# Patient Record
Sex: Female | Born: 1985 | Race: White | Hispanic: Yes | Marital: Married | Smoking: Never smoker
Health system: Southern US, Community
[De-identification: ages and names within clinical notes are randomized; demographics above are authoritative.]

---

## 2009-03-05 ENCOUNTER — Encounter: Payer: Self-pay | Admitting: Family Medicine

## 2009-03-05 ENCOUNTER — Ambulatory Visit (HOSPITAL_COMMUNITY): Admission: RE | Admit: 2009-03-05 | Discharge: 2009-03-05 | Payer: Self-pay | Admitting: Family Medicine

## 2009-04-27 ENCOUNTER — Ambulatory Visit: Payer: Self-pay | Admitting: Obstetrics and Gynecology

## 2009-04-27 ENCOUNTER — Observation Stay (HOSPITAL_COMMUNITY): Admission: AD | Admit: 2009-04-27 | Discharge: 2009-04-28 | Payer: Self-pay | Admitting: Obstetrics & Gynecology

## 2009-05-30 ENCOUNTER — Inpatient Hospital Stay (HOSPITAL_COMMUNITY): Admission: AD | Admit: 2009-05-30 | Discharge: 2009-06-01 | Payer: Self-pay | Admitting: Obstetrics & Gynecology

## 2009-05-30 ENCOUNTER — Ambulatory Visit: Payer: Self-pay | Admitting: Obstetrics and Gynecology

## 2010-12-16 LAB — CBC
HCT: 40.1 % (ref 36.0–46.0)
Platelets: 192 10*3/uL (ref 150–400)
RDW: 13.7 % (ref 11.5–15.5)

## 2010-12-16 LAB — RPR: RPR Ser Ql: NONREACTIVE

## 2010-12-17 LAB — URINALYSIS, ROUTINE W REFLEX MICROSCOPIC
Bilirubin Urine: NEGATIVE
Leukocytes, UA: NEGATIVE
Nitrite: NEGATIVE
Specific Gravity, Urine: <1.005 — ABNORMAL LOW (ref 1.005–1.030)
pH: 6 (ref 5.0–8.0)

## 2010-12-17 LAB — BASIC METABOLIC PANEL
Calcium: 8.5 mg/dL (ref 8.4–10.5)
GFR calc Af Amer: >60 mL/min (ref >60)
GFR calc non Af Amer: >60 mL/min (ref >60)
Glucose, Bld: 92 mg/dL (ref 70–99)
Sodium: 133 mEq/L — ABNORMAL LOW (ref 135–145)

## 2010-12-17 LAB — URINE MICROSCOPIC-ADD ON

## 2010-12-17 LAB — CBC
Hemoglobin: 13.9 g/dL (ref 12.0–15.0)
RDW: 13.1 % (ref 11.5–15.5)

## 2010-12-17 LAB — CULTURE, BLOOD (ROUTINE X 2): Culture: NO GROWTH

## 2010-12-17 LAB — DIFFERENTIAL
Basophils Absolute: 0 10*3/uL (ref 0.0–0.1)
Lymphocytes Relative: 4 % — ABNORMAL LOW (ref 12–46)
Monocytes Absolute: 0.8 10*3/uL (ref 0.1–1.0)
Neutro Abs: 12.7 10*3/uL — ABNORMAL HIGH (ref 1.7–7.7)

## 2010-12-17 LAB — STOOL CULTURE

## 2010-12-17 LAB — CLOSTRIDIUM DIFFICILE EIA

## 2010-12-17 LAB — HEMOCCULT GUIAC POC 1CARD (OFFICE): Fecal Occult Bld: POSITIVE

## 2011-01-24 NOTE — Discharge Summary (Signed)
[name], [name]       ACCOUNT NO.:  [id number]   MEDICAL RECORD NO.:  [id number]          PATIENT TYPE:  OBV   LOCATION:  9158                          FACILITY:  [facility]   PHYSICIAN:  Catalina Antigua, MD     DATE OF BIRTH:  [birth year]   DATE OF ADMISSION:  04/27/2009  DATE OF DISCHARGE:  04/28/2009                               DISCHARGE SUMMARY   PRIMARY CARE Rich Paprocki:  The patient receives prenatal care at the  [facility] Department.   DISCHARGE DIAGNOSES:  1. Gastroenteritis.  2. Pregnancy.   DISCHARGE MEDICATIONS:  1. Tylenol 650 mg p.o. q.6 h. p.r.n. fever.  2. Prenatal vitamin 1 p.o. daily.   CONSULTS:  None.   PROCEDURES:  None.   LABORATORY DATA:  On admission, the patient had a white count of 14.2  with 90% polys, hemoglobin 13.9, and hematocrit 39.8.  Cultures for  stool C diff, urine culture, and blood culture were drawn.  Urinalysis  was done showing negative nitrites and negative leuks.  Chemistry was  done showing sodium of 133, potassium 3.7, chloride 106, CO2 21, glucose  92, BUN 2, creatinine 0.65, and calcium 8.5.   BRIEF HOSPITAL COURSE:  The patient presented to the MAU on April 27, 2009.  The patient was found to be dehydrated.  The patient was started  on lactated Ringer IV fluids.  The patient was found to be febrile to  103.5 and was given 975 mg of Tylenol.  The patient was tachycardic on  admission.  Baby was also tachycardic when the patient first presented.  After Tylenol and IV fluids, the patient's pulse became within normal  limits.  Baby's heart rate also became within normal limits.  The  patient was continued on IV fluids and Tylenol p.r.n. for fever through  hospital course.  The patient's diarrhea lightened up a very small  amount by hospital day #1, but the patient was deemed fit for discharge  as long as she was able to tolerate p.o. fluids.  The patient denied any  nausea or vomiting throughout hospital  course.  The patient will be able  to go home and continue fluid push.   DISCHARGE INSTRUCTIONS:  The patient is to return for any worsening  diarrhea, for any changes in symptoms, and for worsening dehydration.  The patient is to drink water and Gatorade at home and drink as much as  she spills out in diarrhea.  The patient is to take Tylenol p.r.n. for  the fever.   PENDING RESULTS:  1. C diff.  2. Stool culture.  3. Urine culture.   If any cultures come back positive, the patient can be reached at [phone].   FOLLOWUP APPOINTMENTS:  The patient will be following up at the [facility] Department.   DISCHARGE CONDITION:  The patient was discharged to home in stable  medical condition.      Eula Fried, MD      Catalina Antigua, MD  Electronically Signed    DS/MEDQ  D:  04/28/2009  T:  04/28/2009  Job:  [id number]

## 2012-09-25 ENCOUNTER — Encounter (HOSPITAL_COMMUNITY): Payer: Self-pay | Admitting: *Deleted

## 2012-09-25 ENCOUNTER — Emergency Department (HOSPITAL_COMMUNITY)
Admission: EM | Admit: 2012-09-25 | Discharge: 2012-09-25 | Disposition: A | Discharge status: Home / Self Care | Payer: Self-pay | Attending: Emergency Medicine | Admitting: Emergency Medicine

## 2012-09-25 DIAGNOSIS — J029 Acute pharyngitis, unspecified: Secondary | ICD-10-CM | POA: Insufficient documentation

## 2012-09-25 DIAGNOSIS — R509 Fever, unspecified: Secondary | ICD-10-CM | POA: Insufficient documentation

## 2012-09-25 DIAGNOSIS — R1110 Vomiting, unspecified: Secondary | ICD-10-CM | POA: Insufficient documentation

## 2012-09-25 DIAGNOSIS — H6690 Otitis media, unspecified, unspecified ear: Secondary | ICD-10-CM

## 2012-09-25 MED ORDER — AMOXICILLIN 500 MG PO CAPS: 1000.0000 mg | ORAL_CAPSULE | Freq: Two times a day (BID) | ORAL | Status: DC

## 2012-09-25 MED ORDER — ANTIPYRINE-BENZOCAINE 5.4-1.4 % OT SOLN
2.0000 [drp] | OTIC | Status: DC | PRN
  Administered 2012-09-25: 2 [drp] via OTIC
  Filled 2012-09-25: qty 10

## 2012-09-25 NOTE — ED Notes (Signed)
Pt is here with left ear pain that started 3 days ago.  Developed fever on Sunday.  Blood came out of left nare and now pt states that hurts to swallow and all pain is on the left

## 2012-09-25 NOTE — ED Notes (Signed)
Pt given discharge paperwork; no additional questions by pt; pt verbalized understanding of discharge; VSS; e-signature obtained;

## 2012-09-25 NOTE — ED Provider Notes (Signed)
Medical screening examination/treatment/procedure(s) were performed by non-physician practitioner and as supervising physician I was immediately available for consultation/collaboration.   Flint Melter, MD 09/25/12 [phone]

## 2012-09-25 NOTE — ED Provider Notes (Signed)
History   This chart was scribed for non-physician practitioner working with Flint Melter, MD by Gerlean Ren, ED Scribe. This patient was seen in room TR05C/TR05C and the patient's care was started at 8:30 PM.    CSN: [id number]  Arrival date & time 09/25/12  4098   First MD Initiated Contact with Patient 09/25/12 2011      Chief Complaint  Patient presents with  . left facial / ear pain      The history is provided by the patient. A language interpreter was used.   Jessica [name] is a 27 y.o. female who presents to the Emergency Department complaining of a fever that began 01/12 and resolved with the onset of left side otalgia 01/13 that has been constant since with associated sore throat and one episode of non-bloody, non-bilious emesis yesterday but none since.  Pt denies congestion, diarrhea, and rash.  Pt denies tobacco and alcohol use.    History reviewed. No pertinent past medical history.  History reviewed. No pertinent past surgical history.  No family history on file.  History  Substance Use Topics  . Smoking status: Never Smoker   . Smokeless tobacco: Not on file  . Alcohol Use: No    No OB history provided.   Review of Systems  Constitutional: Positive for fever.  HENT: Positive for ear pain and sore throat. Negative for congestion.   Gastrointestinal: Positive for vomiting. Negative for diarrhea.  Skin: Negative for rash.    Allergies  Review of patient's allergies indicates no known allergies.  Home Medications  No current outpatient prescriptions on file.  BP 124/68  Pulse 110  Temp 99.1 F (37.3 C) (Oral)  Resp 18  SpO2 97%  Physical Exam  Nursing note and vitals reviewed. Constitutional: She is oriented to person, place, and time. She appears well-developed and well-nourished. No distress.  HENT:  Head: Normocephalic and atraumatic.       Left TM dull and red, oropharynx red with no exudates  Eyes: EOM are normal.  Neck: Neck  supple. No tracheal deviation present.  Cardiovascular: Normal rate.   Pulmonary/Chest: Effort normal and breath sounds normal. No respiratory distress. She has no wheezes.  Abdominal: Soft. There is no tenderness.  Musculoskeletal: Normal range of motion.  Lymphadenopathy:    She has cervical adenopathy.  Neurological: She is alert and oriented to person, place, and time.  Skin: Skin is warm and dry.  Psychiatric: She has a normal mood and affect. Her behavior is normal.    ED Course  Procedures (including critical care time) DIAGNOSTIC STUDIES: Oxygen Saturation is 97% on room air, adequate by my interpretation.    COORDINATION OF CARE: 8:37 PM- Patient informed of clinical course, understands medical decision-making process, and agrees with plan.  No diagnosis found.  1. Otitis media   MDM  Uncomplicated otitis media.  I personally performed the services described in this documentation, which was scribed in my presence. The recorded information has been reviewed and is accurate.         Arnoldo Hooker, PA-C 09/25/12 2102

## 2013-08-18 ENCOUNTER — Other Ambulatory Visit (HOSPITAL_COMMUNITY): Payer: Self-pay | Admitting: Physician Assistant

## 2013-08-18 DIAGNOSIS — Z3689 Encounter for other specified antenatal screening: Secondary | ICD-10-CM

## 2013-08-18 LAB — OB RESULTS CONSOLE ANTIBODY SCREEN: ANTIBODY SCREEN: NEGATIVE

## 2013-08-18 LAB — OB RESULTS CONSOLE RPR: RPR: NONREACTIVE

## 2013-08-18 LAB — OB RESULTS CONSOLE HIV ANTIBODY (ROUTINE TESTING): HIV: NONREACTIVE

## 2013-08-18 LAB — OB RESULTS CONSOLE GC/CHLAMYDIA
Chlamydia: NEGATIVE
GC PROBE AMP, GENITAL: NEGATIVE

## 2013-08-18 LAB — OB RESULTS CONSOLE ABO/RH: RH Type: POSITIVE

## 2013-08-18 LAB — OB RESULTS CONSOLE RUBELLA ANTIBODY, IGM: Rubella: IMMUNE

## 2013-08-18 LAB — OB RESULTS CONSOLE HEPATITIS B SURFACE ANTIGEN: Hepatitis B Surface Ag: NEGATIVE

## 2013-09-11 NOTE — L&D Delivery Note (Signed)
Delivery Note  At 10:13 AM a viable female was delivered via Vaginal, Spontaneous Delivery (Presentation: Right Occiput Anterior). APGAR: 9, 10; weight TBD.  Placenta status: Intact, Spontaneous. Cord: 3 vessels with the following complications: None.  Anesthesia: Epidural  Episiotomy: None  Lacerations: Periurethral  Suture Repair: na  Est. Blood Loss (mL): 200  Mom to postpartum. Baby to Couplet care / Skin to Skin.    Shakeya Kerkman L Giovannina Mun  02/16/2014, 10:44 AM    DELIVERY NOTE:  Called to delivery. Mother pushed over 2 minutes. Infant delivered ROA, no nuchal cord, placed on maternal abdomen. Cord clamped and cut. Active management of 3rd stage with traction and Pitocin. Placenta delivered intact with 3v cord. Small <1cm periurethral tear, not repaired, hemostatic. EBL200. Counts correct. Hemostatic.    Sunnie Nielsen, DO, PGY2  With Rulon Abide, MD   I was present for and supervised the delivery of this newborn. I agree with above documentation.   Vale Haven, MD

## 2013-09-15 ENCOUNTER — Ambulatory Visit (HOSPITAL_COMMUNITY)
Admission: RE | Admit: 2013-09-15 | Discharge: 2013-09-15 | Disposition: A | Discharge status: Home / Self Care | Payer: Medicaid Other | Source: Ambulatory Visit | Attending: Physician Assistant | Admitting: Physician Assistant

## 2013-09-15 ENCOUNTER — Ambulatory Visit (HOSPITAL_COMMUNITY): Payer: Self-pay

## 2013-09-15 DIAGNOSIS — Z3689 Encounter for other specified antenatal screening: Secondary | ICD-10-CM | POA: Insufficient documentation

## 2014-02-12 ENCOUNTER — Other Ambulatory Visit (HOSPITAL_COMMUNITY): Payer: Self-pay | Admitting: Nurse Practitioner

## 2014-02-12 DIAGNOSIS — O480 Post-term pregnancy: Secondary | ICD-10-CM

## 2014-02-15 ENCOUNTER — Encounter (HOSPITAL_COMMUNITY): Payer: Self-pay

## 2014-02-15 ENCOUNTER — Inpatient Hospital Stay (HOSPITAL_COMMUNITY)
Admission: AD | Admit: 2014-02-15 | Discharge: 2014-02-17 | DRG: 775 | Disposition: A | Discharge status: Home / Self Care | Payer: Medicaid Other | Source: Ambulatory Visit | Attending: Obstetrics and Gynecology | Admitting: Obstetrics and Gynecology

## 2014-02-15 DIAGNOSIS — O4290 Premature rupture of membranes, unspecified as to length of time between rupture and onset of labor, unspecified weeks of gestation: Secondary | ICD-10-CM | POA: Diagnosis present

## 2014-02-15 LAB — OB RESULTS CONSOLE GBS: STREP GROUP B AG: NEGATIVE

## 2014-02-16 ENCOUNTER — Encounter (HOSPITAL_COMMUNITY): Payer: Self-pay

## 2014-02-16 ENCOUNTER — Ambulatory Visit (HOSPITAL_COMMUNITY)
Admission: RE | Admit: 2014-02-16 | Discharge: 2014-02-16 | Disposition: A | Discharge status: Home / Self Care | Payer: Self-pay | Source: Ambulatory Visit | Attending: Nurse Practitioner | Admitting: Nurse Practitioner

## 2014-02-16 ENCOUNTER — Inpatient Hospital Stay (HOSPITAL_COMMUNITY): Payer: Medicaid Other | Admitting: Anesthesiology

## 2014-02-16 ENCOUNTER — Encounter (HOSPITAL_COMMUNITY): Payer: Medicaid Other | Admitting: Anesthesiology

## 2014-02-16 DIAGNOSIS — O479 False labor, unspecified: Secondary | ICD-10-CM | POA: Diagnosis present

## 2014-02-16 DIAGNOSIS — O4290 Premature rupture of membranes, unspecified as to length of time between rupture and onset of labor, unspecified weeks of gestation: Secondary | ICD-10-CM | POA: Diagnosis present

## 2014-02-16 LAB — CBC
HCT: 37.9 % (ref 36.0–46.0)
Hemoglobin: 13.3 g/dL (ref 12.0–15.0)
MCH: 32.8 pg (ref 26.0–34.0)
MCHC: 35.1 g/dL (ref 30.0–36.0)
MCV: 93.6 fL (ref 78.0–100.0)
Platelets: 200 10*3/uL (ref 150–400)
RBC: 4.05 MIL/uL (ref 3.87–5.11)
RDW: 13.8 % (ref 11.5–15.5)
WBC: 10.9 10*3/uL — ABNORMAL HIGH (ref 4.0–10.5)

## 2014-02-16 LAB — TYPE AND SCREEN
ABO/RH(D): O POS
Antibody Screen: NEGATIVE

## 2014-02-16 LAB — POCT FERN TEST: POCT FERN TEST: POSITIVE

## 2014-02-16 LAB — ABO/RH: ABO/RH(D): O POS

## 2014-02-16 LAB — SYPHILIS: RPR W/REFLEX TO RPR TITER AND TREPONEMAL ANTIBODIES, TRADITIONAL SCREENING AND DIAGNOSIS ALGORITHM

## 2014-02-16 MED ORDER — PRENATAL MULTIVITAMIN CH
1.0000 | ORAL_TABLET | Freq: Every day | ORAL | Status: DC
  Filled 2014-02-16 (×2): qty 1

## 2014-02-16 MED ORDER — ONDANSETRON HCL 4 MG PO TABS: 4.0000 mg | ORAL_TABLET | ORAL | Status: DC | PRN

## 2014-02-16 MED ORDER — BENZOCAINE-MENTHOL 20-0.5 % EX AERO
1.0000 "application " | INHALATION_SPRAY | CUTANEOUS | Status: DC | PRN
  Administered 2014-02-16: 1 via TOPICAL
  Filled 2014-02-16: qty 56

## 2014-02-16 MED ORDER — WITCH HAZEL-GLYCERIN EX PADS: 1.0000 "application " | MEDICATED_PAD | CUTANEOUS | Status: DC | PRN

## 2014-02-16 MED ORDER — EPHEDRINE 5 MG/ML INJ
10.0000 mg | INTRAVENOUS | Status: DC | PRN
  Filled 2014-02-16: qty 2
  Filled 2014-02-16: qty 4

## 2014-02-16 MED ORDER — OXYCODONE-ACETAMINOPHEN 5-325 MG PO TABS: 1.0000 | ORAL_TABLET | ORAL | Status: DC | PRN

## 2014-02-16 MED ORDER — DIBUCAINE 1 % RE OINT: 1.0000 "application " | TOPICAL_OINTMENT | RECTAL | Status: DC | PRN

## 2014-02-16 MED ORDER — LIDOCAINE HCL (PF) 1 % IJ SOLN
INTRAMUSCULAR | Status: DC | PRN
  Administered 2014-02-16: 4 mL

## 2014-02-16 MED ORDER — DIPHENHYDRAMINE HCL 50 MG/ML IJ SOLN: 12.5000 mg | INTRAMUSCULAR | Status: DC | PRN

## 2014-02-16 MED ORDER — DIPHENHYDRAMINE HCL 25 MG PO CAPS: 25.0000 mg | ORAL_CAPSULE | Freq: Four times a day (QID) | ORAL | Status: DC | PRN

## 2014-02-16 MED ORDER — PRENATAL MULTIVITAMIN CH
1.0000 | ORAL_TABLET | Freq: Every day | ORAL | Status: DC
  Administered 2014-02-16 – 2014-02-17 (×2): 1 via ORAL

## 2014-02-16 MED ORDER — FENTANYL 2.5 MCG/ML BUPIVACAINE 1/10 % EPIDURAL INFUSION (WH - ANES): 14.0000 mL/h | INTRAMUSCULAR | Status: DC | PRN

## 2014-02-16 MED ORDER — FENTANYL 2.5 MCG/ML BUPIVACAINE 1/10 % EPIDURAL INFUSION (WH - ANES)
INTRAMUSCULAR | Status: DC | PRN
  Administered 2014-02-16: 14 mL/h via EPIDURAL

## 2014-02-16 MED ORDER — SIMETHICONE 80 MG PO CHEW: 80.0000 mg | CHEWABLE_TABLET | ORAL | Status: DC | PRN

## 2014-02-16 MED ORDER — EPHEDRINE 5 MG/ML INJ
10.0000 mg | INTRAVENOUS | Status: DC | PRN
  Filled 2014-02-16: qty 2

## 2014-02-16 MED ORDER — ONDANSETRON HCL 4 MG/2ML IJ SOLN: 4.0000 mg | INTRAMUSCULAR | Status: DC | PRN

## 2014-02-16 MED ORDER — OXYCODONE-ACETAMINOPHEN 5-325 MG PO TABS
1.0000 | ORAL_TABLET | ORAL | Status: DC | PRN
  Administered 2014-02-17: 1 via ORAL
  Filled 2014-02-16: qty 1

## 2014-02-16 MED ORDER — LANOLIN HYDROUS EX OINT: TOPICAL_OINTMENT | CUTANEOUS | Status: DC | PRN

## 2014-02-16 MED ORDER — LACTATED RINGERS IV SOLN
500.0000 mL | Freq: Once | INTRAVENOUS | Status: AC
  Administered 2014-02-16: 500 mL via INTRAVENOUS

## 2014-02-16 MED ORDER — LACTATED RINGERS IV SOLN
INTRAVENOUS | Status: DC
  Administered 2014-02-16: 08:00:00 via INTRAVENOUS

## 2014-02-16 MED ORDER — FENTANYL 2.5 MCG/ML BUPIVACAINE 1/10 % EPIDURAL INFUSION (WH - ANES)
14.0000 mL/h | INTRAMUSCULAR | Status: DC | PRN
  Filled 2014-02-16: qty 125

## 2014-02-16 MED ORDER — ACETAMINOPHEN 325 MG PO TABS: 650.0000 mg | ORAL_TABLET | ORAL | Status: DC | PRN

## 2014-02-16 MED ORDER — IBUPROFEN 600 MG PO TABS: 600.0000 mg | ORAL_TABLET | Freq: Four times a day (QID) | ORAL | Status: DC | PRN

## 2014-02-16 MED ORDER — LACTATED RINGERS IV SOLN
500.0000 mL | INTRAVENOUS | Status: DC | PRN
  Administered 2014-02-16: 500 mL via INTRAVENOUS

## 2014-02-16 MED ORDER — CITRIC ACID-SODIUM CITRATE 334-500 MG/5ML PO SOLN: 30.0000 mL | ORAL | Status: DC | PRN

## 2014-02-16 MED ORDER — OXYTOCIN BOLUS FROM INFUSION
500.0000 mL | INTRAVENOUS | Status: DC
  Administered 2014-02-16: 500 mL via INTRAVENOUS

## 2014-02-16 MED ORDER — LIDOCAINE HCL (PF) 1 % IJ SOLN
30.0000 mL | INTRAMUSCULAR | Status: DC | PRN
  Filled 2014-02-16: qty 30

## 2014-02-16 MED ORDER — IBUPROFEN 600 MG PO TABS
600.0000 mg | ORAL_TABLET | Freq: Four times a day (QID) | ORAL | Status: DC
  Administered 2014-02-16 – 2014-02-17 (×5): 600 mg via ORAL
  Filled 2014-02-16 (×5): qty 1

## 2014-02-16 MED ORDER — TETANUS-DIPHTH-ACELL PERTUSSIS 5-2.5-18.5 LF-MCG/0.5 IM SUSP: 0.5000 mL | Freq: Once | INTRAMUSCULAR | Status: DC

## 2014-02-16 MED ORDER — OXYTOCIN 40 UNITS IN LACTATED RINGERS INFUSION - SIMPLE MED
62.5000 mL/h | INTRAVENOUS | Status: DC
  Filled 2014-02-16: qty 1000

## 2014-02-16 MED ORDER — SENNOSIDES-DOCUSATE SODIUM 8.6-50 MG PO TABS
2.0000 | ORAL_TABLET | ORAL | Status: DC
  Administered 2014-02-17: 2 via ORAL
  Filled 2014-02-16: qty 2

## 2014-02-16 MED ORDER — FENTANYL CITRATE 0.05 MG/ML IJ SOLN
50.0000 ug | INTRAMUSCULAR | Status: DC | PRN
  Filled 2014-02-16: qty 2

## 2014-02-16 MED ORDER — ZOLPIDEM TARTRATE 5 MG PO TABS: 5.0000 mg | ORAL_TABLET | Freq: Every evening | ORAL | Status: DC | PRN

## 2014-02-16 MED ORDER — ONDANSETRON HCL 4 MG/2ML IJ SOLN: 4.0000 mg | Freq: Four times a day (QID) | INTRAMUSCULAR | Status: DC | PRN

## 2014-02-16 MED ORDER — PHENYLEPHRINE 40 MCG/ML (10ML) SYRINGE FOR IV PUSH (FOR BLOOD PRESSURE SUPPORT)
80.0000 ug | PREFILLED_SYRINGE | INTRAVENOUS | Status: DC | PRN
  Filled 2014-02-16: qty 10
  Filled 2014-02-16: qty 2

## 2014-02-16 MED ORDER — PHENYLEPHRINE 40 MCG/ML (10ML) SYRINGE FOR IV PUSH (FOR BLOOD PRESSURE SUPPORT)
80.0000 ug | PREFILLED_SYRINGE | INTRAVENOUS | Status: DC | PRN
  Filled 2014-02-16: qty 2

## 2014-02-16 NOTE — Anesthesia Procedure Notes (Signed)
Epidural Patient location during procedure: OB  Staffing Anesthesiologist: Jestine Bicknell R Performed by: anesthesiologist   Preanesthetic Checklist Completed: patient identified, pre-op evaluation, timeout performed, IV checked, risks and benefits discussed and monitors and equipment checked  Epidural Patient position: sitting Prep: site prepped and draped and DuraPrep Patient monitoring: heart rate Approach: midline Location: L2-L3 Injection technique: LOR air and LOR saline  Needle:  Needle type: Tuohy  Needle gauge: 17 G Needle length: 9 cm Needle insertion depth: 6 cm Catheter type: closed end flexible Catheter size: 19 Gauge Catheter at skin depth: 12 cm Test dose: negative  Assessment Sensory level: T8 Events: blood not aspirated, injection not painful, no injection resistance, negative IV test and no paresthesia  Additional Notes Reason for block:procedure for pain   

## 2014-02-16 NOTE — H&P (Signed)
Jessica [name] is a 28 y.o. female presenting for Leaking of fluid . Maternal Medical History:  Reason for admission: Nausea.    OB History   Grav Para Term Preterm Abortions TAB SAB Ect Mult Living   2 1 1       1      History reviewed. No pertinent past medical history. History reviewed. No pertinent past surgical history. Family History: family history is not on file. Social History:  reports that she has never smoked. She does not have any smokeless tobacco history on file. She reports that she does not drink alcohol or use illicit drugs.  Review of Systems  Constitutional: Negative for fever, chills and malaise/fatigue.  Gastrointestinal: Positive for abdominal pain (mild). Negative for nausea, vomiting and diarrhea.  Genitourinary: Negative for dysuria.  Neurological: Negative for dizziness.    Dilation: 2 Effacement (%): 50 Station: -2 Exam by:: D Simpson RN Blood pressure 133/80, pulse 95, temperature 98 F (36.7 C), temperature source Oral, resp. rate 18, height 5' 3.5" (1.613 m), weight 96.707 kg (213 lb 3.2 oz). Maternal Exam:  Uterine Assessment: Contraction strength is mild.  Contraction frequency is regular.   Abdomen: Fundal height is 39.   Fetal presentation: vertex  Introitus: Normal vulva. Vagina is positive for vaginal discharge (clear liquid).  Ferning test: positive.  Amniotic fluid character: clear.  Pelvis: adequate for delivery.   Cervix: Cervix evaluated by digital exam.     Fetal Exam Fetal Monitor Review: Mode: ultrasound.   Baseline rate: 135.  Variability: moderate (6-25 bpm).   Pattern: no decelerations and accelerations present.    Fetal State Assessment: Category I - tracings are normal.     Physical Exam  Constitutional: She is oriented to person, place, and time. She appears well-developed and well-nourished. No distress.  HENT:  Head: Normocephalic.  Cardiovascular: Normal rate.   Respiratory: Effort normal.  GI:  Soft. She exhibits no distension and no mass. There is no tenderness. There is no rebound and no guarding.  Genitourinary: Uterus normal. Vaginal discharge (clear liquid) found.  Dilation: 2 Effacement (%): 50 Cervical Position: Middle Station: -2 Presentation: Vertex Exam by:: D Simpson RN   Musculoskeletal: Normal range of motion.  Neurological: She is alert and oriented to person, place, and time.  Skin: Skin is warm and dry.  Psychiatric: She has a normal mood and affect.    Prenatal labs: ABO, Rh:   Antibody:   Rubella:   RPR:    HBsAg:    HIV:    GBS: Negative (06/07 0000)   Assessment/Plan: A:  SIUP at [redacted]w[redacted]d       PROM at term      Early labor  P:  Admit to [facility] Suites      Routine orders       Anticipate SVD  Aviva Signs 02/16/2014, 12:41 AM

## 2014-02-16 NOTE — Progress Notes (Signed)
Patient ID: Jessica [name], female   DOB: 1986/04/05, 28 y.o.   MRN: [id number]  Now has epidural Comfortable  Filed Vitals:   02/16/14 0630  BP: 95/51  Pulse: 111  Temp:   Resp: 18    FHR reassuring, with small variables  Dilation: 3.5 Effacement (%): 70 Cervical Position: Middle Station: -2 Presentation: Vertex Exam by:: [employer]  Will continue to observe

## 2014-02-16 NOTE — Lactation Note (Signed)
This note was copied from the chart of Jessica [name]. Lactation Consultation Note  Patient Name: Jessica [name] MPNTI'R Date: 02/16/2014 Reason for consult: Initial assessment of this mom and baby at 12 hours postpartum.  Mom is sound asleep and partner also asleep.  LC provided [facility] LC packet in Spanish (left at bedside)    Maternal Data Formula Feeding for Exclusion: Yes Reason for exclusion: Mother's choice to formula and breast feed on admission Infant to breast within first hour of birth: Yes (initial LATCH score=10) Has patient been taught Hand Expression?:  (not documented) Does the patient have breastfeeding experience prior to this delivery?:  (unknown (mom asleep but has 33 yo))  Feeding Feeding Type: Breast Fed Length of feed: 5 min  LATCH Score/Interventions         LATCH scores=9/10 with four 15-20 minute feedings since birth and voids and stools             Lactation Tools Discussed/Used   Brochure left in room  Consult Status Consult Status: Follow-up Date: 02/17/14 Follow-up type: In-patient    Jessica [name] 02/16/2014, 11:09 PM

## 2014-02-16 NOTE — Anesthesia Preprocedure Evaluation (Signed)
Anesthesia Evaluation  Patient identified by MRN, date of birth, ID band Patient awake    Reviewed: Allergy & Precautions, H&P , NPO status , Patient's Chart, lab work & pertinent test results  Airway Mallampati: II TM Distance: >3 FB Neck ROM: Full    Dental  (+) Dental Advisory Given   Pulmonary neg pulmonary ROS,  breath sounds clear to auscultation        Cardiovascular negative cardio ROS  Rhythm:Regular Rate:Normal     Neuro/Psych negative neurological ROS  negative psych ROS   GI/Hepatic negative GI ROS, Neg liver ROS,   Endo/Other  negative endocrine ROS  Renal/GU negative Renal ROS     Musculoskeletal negative musculoskeletal ROS (+)   Abdominal (+) + obese,   Peds  Hematology negative hematology ROS (+)   Anesthesia Other Findings   Reproductive/Obstetrics (+) Pregnancy                           Anesthesia Physical Anesthesia Plan  ASA: II  Anesthesia Plan: Epidural   Post-op Pain Management:    Induction:   Airway Management Planned:   Additional Equipment:   Intra-op Plan:   Post-operative Plan:   Informed Consent: I have reviewed the patients History and Physical, chart, labs and discussed the procedure including the risks, benefits and alternatives for the proposed anesthesia with the patient or authorized representative who has indicated his/her understanding and acceptance.     Plan Discussed with:   Anesthesia Plan Comments:         Anesthesia Quick Evaluation

## 2014-02-16 NOTE — Progress Notes (Signed)
Delivery Note At 10:13 AM a viable female was delivered via Vaginal, Spontaneous Delivery (Presentation: Right Occiput Anterior).  APGAR: 9, 10; weight TBD.   Placenta status: Intact, Spontaneous.  Cord: 3 vessels with the following complications: None.    Anesthesia: Epidural  Episiotomy: None Lacerations: Periurethral Suture Repair: na Est. Blood Loss (mL): 200  Mom to postpartum.  Baby to Couplet care / Skin to Skin.  Brand Siever L Lealand Elting 02/16/2014, 10:44 AM   DELIVERY NOTE:  Called to delivery. Mother pushed over 2 minutes. Infant delivered ROA, no nuchal cord, placed on maternal abdomen. Cord clamped and cut. Active management of 3rd stage with traction and Pitocin. Placenta delivered intact with 3v cord. Small <1cm periurethral tear, not repaired, hemostatic. EBL200. Counts correct. Hemostatic.   Sunnie Nielsen, DO, PGY2 With Dia Crawford, MD

## 2014-02-16 NOTE — Progress Notes (Signed)
Patient ID: Jessica [name], female   DOB: [birth year], 28 y.o.   MRN: [id number] Doing well, not hurting much  Filed Vitals:   02/16/14 0003 02/16/14 0013 02/16/14 0140  BP:  133/80 122/73  Pulse:  95 89  Temp:  98 F (36.7 C) 98.5 F (36.9 C)  TempSrc:  Oral Oral  Resp:  18 18  Height: 5' 3.5" (1.613 m)    Weight: 96.707 kg (213 lb 3.2 oz)      FHR reactive UCs irregular  Dilation: 2 Effacement (%): 50 Cervical Position: Middle Station: -2 Presentation: Vertex Exam by:: D Simpson RN  Will give her a couple of hours more and if there is no progress, will offer Pitocin Augmentation.

## 2014-02-16 NOTE — MAU Note (Signed)
Pt states she thinks her water broke a1 1030pm. Some uc. +FM.

## 2014-02-17 ENCOUNTER — Ambulatory Visit: Payer: Self-pay

## 2014-02-17 MED ORDER — IBUPROFEN 600 MG PO TABS: 600.0000 mg | ORAL_TABLET | Freq: Four times a day (QID) | ORAL | Status: AC

## 2014-02-17 MED ORDER — DOCUSATE SODIUM 50 MG PO CAPS: 50.0000 mg | ORAL_CAPSULE | Freq: Two times a day (BID) | ORAL | Status: AC

## 2014-02-17 MED ORDER — DOCUSATE SODIUM 50 MG PO CAPS: 50.0000 mg | ORAL_CAPSULE | Freq: Two times a day (BID) | ORAL | Status: DC

## 2014-02-17 MED ORDER — IBUPROFEN 600 MG PO TABS: 600.0000 mg | ORAL_TABLET | Freq: Four times a day (QID) | ORAL | Status: DC

## 2014-02-17 NOTE — Lactation Note (Addendum)
This note was copied from the chart of Jessica Cathye Dupin. Lactation Consultation Note  Patient Name: Jessica [name] ZMCEY'E Date: 02/17/2014  Asked to assess mom's breasts and baby nursing by patient's MBU RN. Mom return demonstrated hand massage and hand expression, small amounts of colostrum noted. Fitted mom with a size #30 flange for hand pump. Enc mom to use hand pump, colostrum began to drip. Mom return demonstrated hand massage and pumping. Mom able to latch baby in cross-cradle position and latch baby well. Demonstrated to mom how to tug chin and flange lower lip. Baby sucks rhythmically with swallows. Show mom in Mother and Pecola Leisure book the chart of number and king of diapers to expect, and showed mom where to find [facility] phone number if she needs assistance.    Maternal Data    Feeding    LATCH Score/Interventions                      Lactation Tools Discussed/Used     Consult Status      Jessica [name] 02/17/2014, 4:27 PM

## 2014-02-17 NOTE — Discharge Summary (Signed)
Obstetric Discharge Summary Reason for Admission: onset of labor Prenatal Procedures: none Intrapartum Procedures: spontaneous vaginal delivery Postpartum Procedures: none Complications-Operative and Postpartum: none Hemoglobin  Date Value Ref Range Status  02/16/2014 13.3  12.0 - 15.0 g/dL Final     HCT  Date Value Ref Range Status  02/16/2014 37.9  36.0 - 46.0 % Final    Physical Exam:  General: alert, cooperative, appears stated age and no distress Lochia: appropriate Uterine Fundus: firm Incision: none DVT Evaluation: Negative Homan's sign.  Discharge Diagnoses: Term [place] Surgical Course: Presented for active labor/SROM, labor progressed normally, At 10:13 AM 02/16/2014 a viable female was delivered via Vaginal, Spontaneous Delivery (Presentation: Right Occiput Anterior). APGAR: 9, 10; weight TBD. Placenta status: Intact, Spontaneous. Cord: 3 vessels with the following complications: None. Anesthesia: Epidural Episiotomy: None Lacerations: Periurethral Suture Repair: na Est. Blood Loss (mL): 200 Mom to postpartum. Baby to Couplet care / Skin to Skin. Patient seen this morning with interpreter (Eda). Postpartum milestones met. Ambulating, tolerating diet, breastfeeding no problems, lochia decreasing. Mom plans to use condoms for contraception, counseled on benefits of LARC. No additional questions.  Discussed with fellow, Dr Olevia Bowens.   Discharge Information: Date: 02/17/2014 Activity: pelvic rest Diet: routine Medications: PNV and Ibuprofen Condition: stable Instructions: refer to practice specific booklet Discharge to: home Follow-up Information   [facility] up with [facility]. Schedule an appointment as soon as possible for a visit in 6 weeks. (for postpartum care)       Newborn Data: Live born female  Birth Weight: 7 lb 4.9 oz (3314 g) APGAR: 9, 10  Home with mother.  Emeterio Reeve 02/17/2014, 7:45 AM  I spoke with and examined patient and agree  with resident's note and plan of care.  Fredrik Rigger, MD OB Fellow 02/17/2014 7:55 AM

## 2014-02-17 NOTE — Anesthesia Postprocedure Evaluation (Signed)
  Anesthesia Post-op Note  Patient: Jessica [name]  Procedure(s) Performed: * No procedures listed *  Patient Location: PACU and Mother/Baby  Anesthesia Type:Epidural  Level of Consciousness: awake, alert  and oriented  Airway and Oxygen Therapy: Patient Spontanous Breathing  Post-op Pain: mild  Post-op Assessment: Post-op Vital signs reviewed, Patient's Cardiovascular Status Stable, Respiratory Function Stable, No signs of Nausea or vomiting, Adequate PO intake, Pain level controlled, No headache, No backache, No residual numbness and No residual motor weakness  Post-op Vital Signs: Reviewed and stable  Last Vitals:  Filed Vitals:   02/17/14 0600  BP: 99/67  Pulse: 90  Temp: 36.5 C  Resp: 18    Complications: No apparent anesthesia complications

## 2014-02-17 NOTE — Discharge Instructions (Signed)

## 2014-02-19 NOTE — Progress Notes (Signed)
Ur chart review completed.  

## 2014-02-22 ENCOUNTER — Inpatient Hospital Stay (HOSPITAL_COMMUNITY): Admission: RE | Admit: 2014-02-22 | Payer: Self-pay | Source: Ambulatory Visit

## 2014-07-13 ENCOUNTER — Encounter (HOSPITAL_COMMUNITY): Payer: Self-pay

## 2015-05-04 IMAGING — US US OB COMP +14 WK
1 series · 12 of 28 positions shown · non-contrast
Comparison: none

[Series 1: us ob comp +14 wk · 98 acquisitions, 12 frames shown]
[im 4/98]
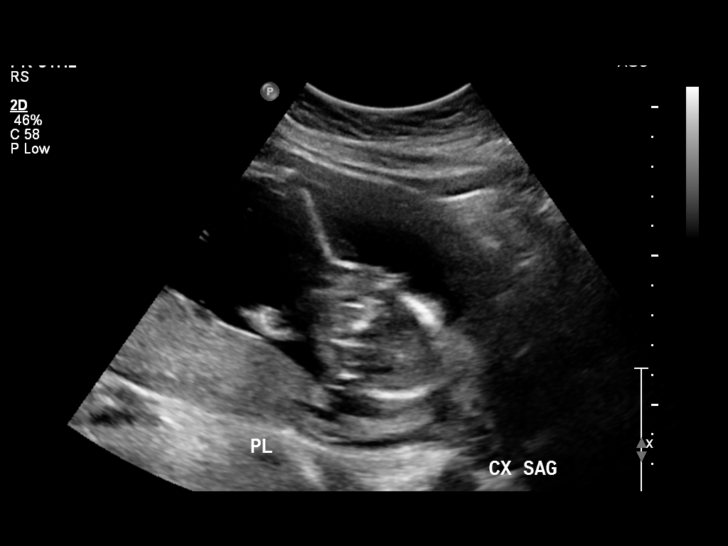
[im 11/98]
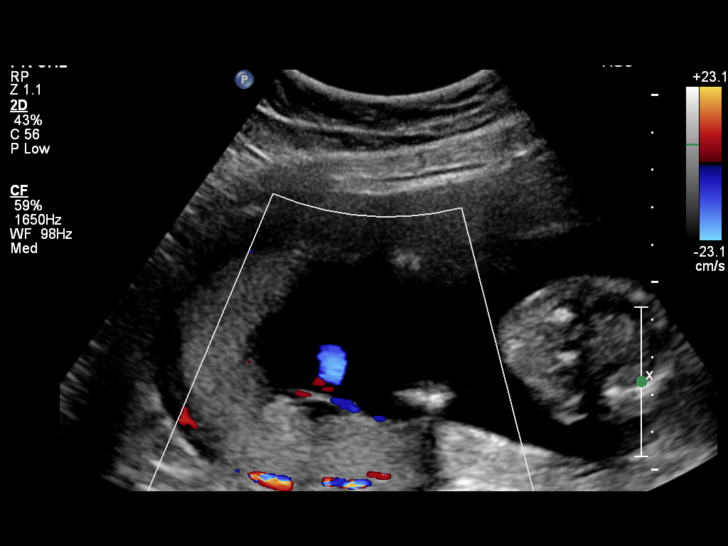
[im 18/98]
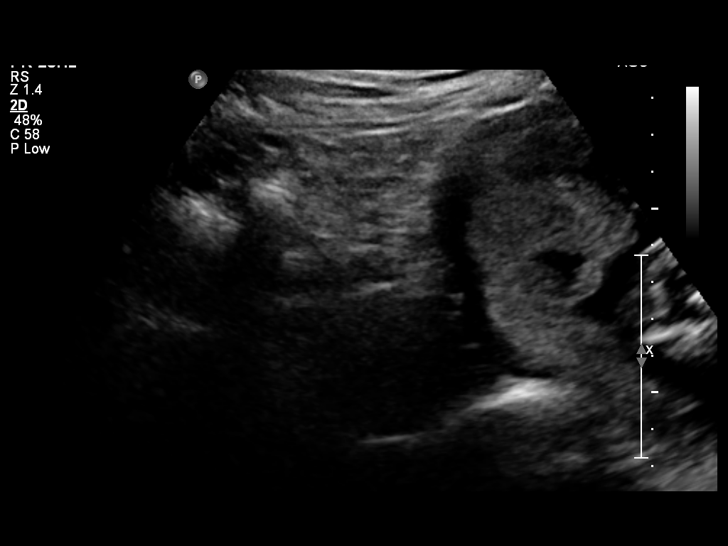
[im 29/98]
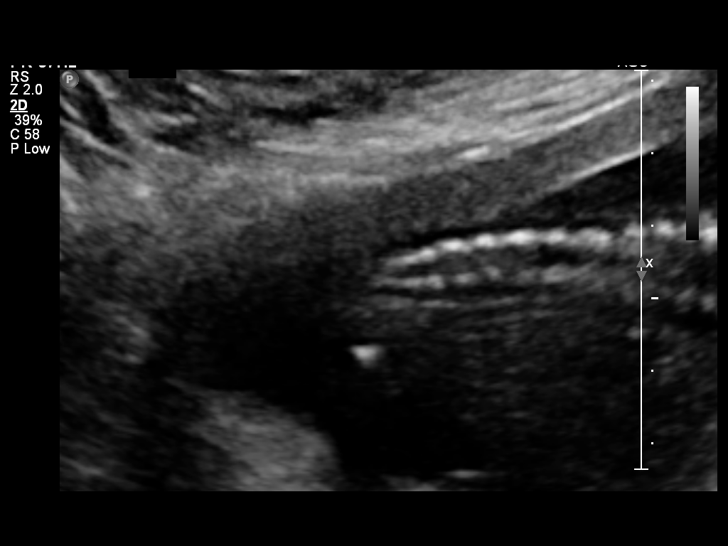
[im 36/98]
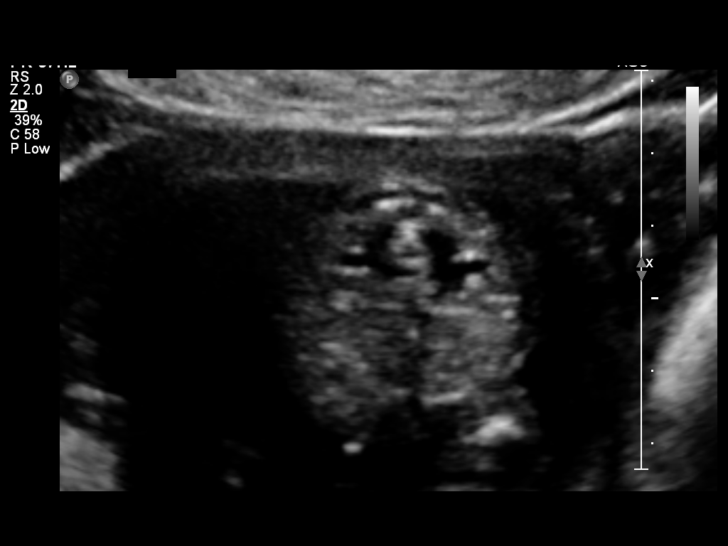
[im 44/98]
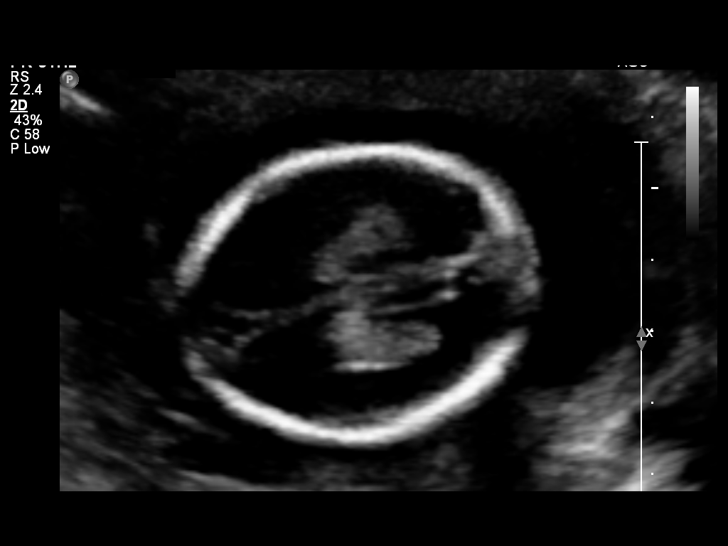
[im 54/98]
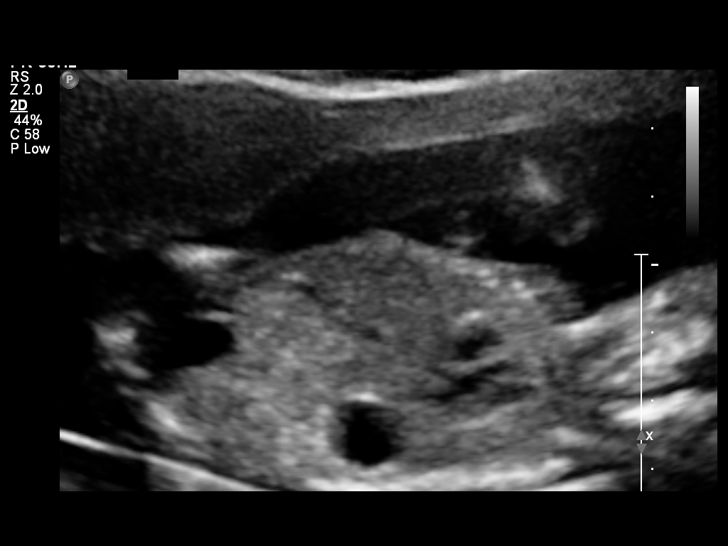
[im 62/98]
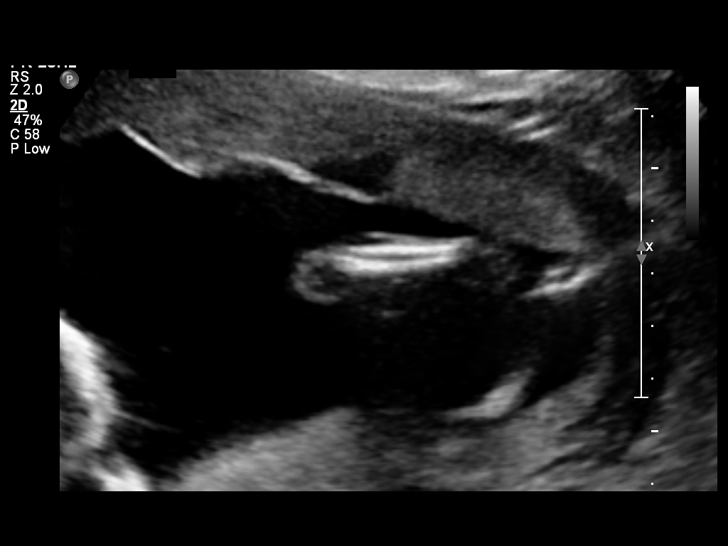
[im 69/98]
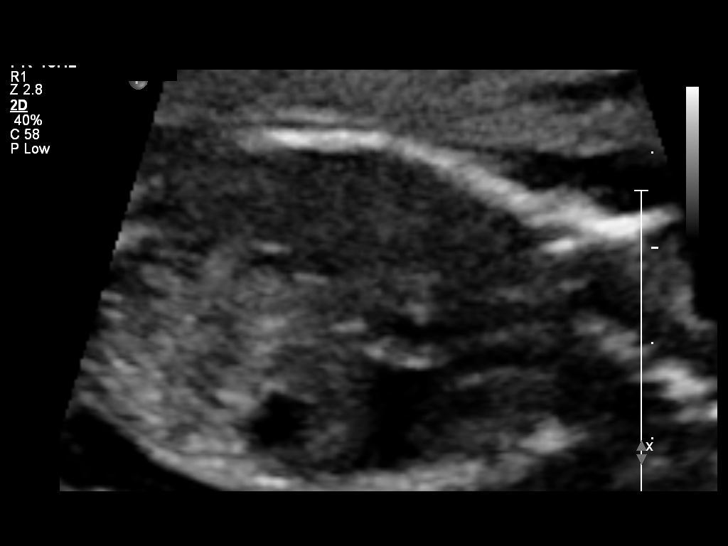
[im 80/98]
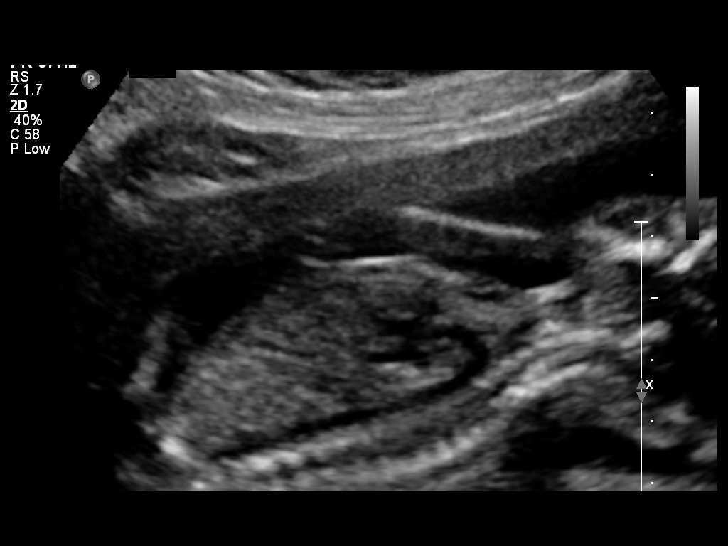
[im 87/98]
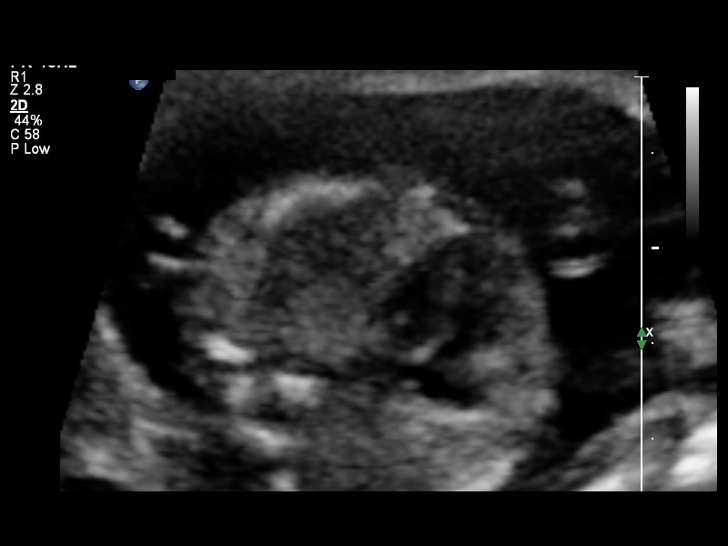
[im 94/98]
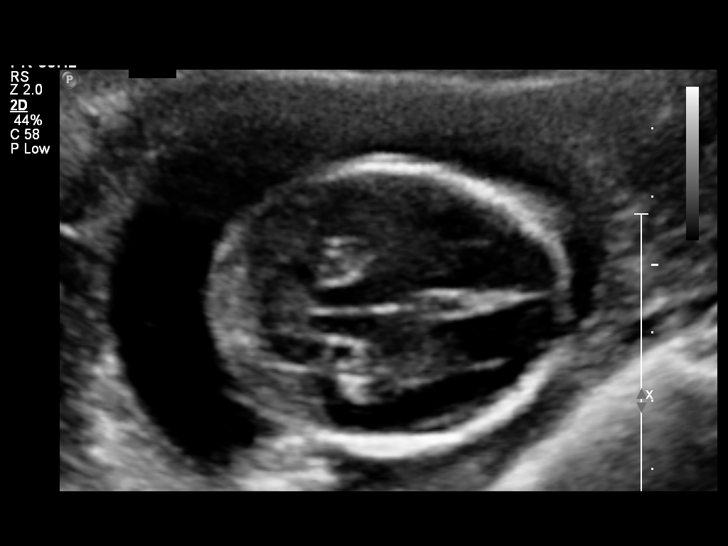

[12 of 28 positions shown; findings below may reference images not displayed]

OBSTETRICS REPORT
                      (Signed Final 09/15/2013 [DATE])

             LEEANNE

Service(s) Provided

 US OB COMP + 14 WK                                    76805.1
Indications

 Basic anatomic survey
Fetal Evaluation

 Num Of Fetuses:    1
 Fetal Heart Rate:  142                          bpm
 Cardiac Activity:  Observed
 Presentation:      Cephalic
 Placenta:          Posterior, above cervical
                    os
 P. Cord            Visualized
 Insertion:

 Amniotic Fluid
 AFI FV:      Subjectively within normal limits
                                             Larg Pckt:     4.9  cm
Biometry

 BPD:     40.4  mm     G. Age:  18w 2d                CI:        69.27   70 - 86
                                                      FL/HC:      16.6   16.1 -

 HC:       155  mm     G. Age:  18w 3d       37  %    HC/AC:      1.26   1.09 -

 AC:     123.4  mm     G. Age:  18w 0d       28  %    FL/BPD:
 FL:      25.8  mm     G. Age:  17w 6d       20  %    FL/AC:      20.9   20 - 24
 NFT:        4  mm

 Est. FW:     218  gm      0 lb 8 oz     34  %
Gestational Age

 LMP:           18w 4d        Date:  05/08/13                 EDD:   02/12/14
 U/S Today:     18w 1d                                        EDD:   02/15/14
 Best:          18w 4d     Det. By:  LMP  (05/08/13)          EDD:   02/12/14
Anatomy

 Cranium:          Appears normal         Aortic Arch:      Appears normal
 Fetal Cavum:      Appears normal         Ductal Arch:      Appears normal
 Ventricles:       Appears normal         Diaphragm:        Appears normal
 Choroid Plexus:   Appears normal         Stomach:          Appears normal, left
                                                            sided
 Cerebellum:       Appears normal         Abdomen:          Appears normal
 Posterior Fossa:  Appears normal         Abdominal Wall:   Not well visualized
 Nuchal Fold:      Appears normal         Cord Vessels:     Appears normal (3
                                                            vessel cord)
 Face:             Appears normal         Kidneys:          Appear normal
                   (orbits and profile)
 Lips:             Not well visualized    Bladder:          Appears normal
 Heart:            Appears normal         Spine:            Appears normal
                   (4CH, axis, and
                   situs)
 RVOT:             Appears normal         Lower             Appears normal
                                          Extremities:
 LVOT:             Appears normal         Upper             Appears normal
                                          Extremities:

 Other:  Male gender. Technically difficult due to fetal position.
Cervix Uterus Adnexa

 Cervical Length:    3.2      cm

 Cervix:       Normal appearance by transabdominal scan.

 Left Ovary:    Within normal limits.
 Right Ovary:   Within normal limits.
 Adnexa:     No abnormality visualized.
Impression

 Active SIUP at 93w8d--remote read
 EFW 34th%
 No dysmorphic features
 No previa
Recommendations

 Recommend follow up evaluation in 4-6 weeks to complete
 the fetal survey (please schedule).

## 2020-05-27 ENCOUNTER — Encounter: Payer: Self-pay | Admitting: Neurology

## 2020-07-25 NOTE — Progress Notes (Signed)
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  History supplemented by referring provider's note.  Spanish-speaking interpreter present.  About 6 months ago, she began experiencing burning sensation in her mouth.  It feels like she ate spicy food.  Her tongue also feels numb.  It started just in the lower mouth, then the posterior tongue, then upper palate and eventually the entire inner mouth, tongue and throat.  It has gradually become worse.  No preceding dental procedures.  Talking, eating or drinking may aggravate it.  Nothing relieves it.  She also complained of white patches on her tonslies, redness of pharynx and tonsils, burning sensation in her throat, swollen tonsils, dry mouth and halitosis.  She was found to have no tonsil stones, swollen ducts or erythema or blockage of ducts.  She denied headache, nausea, vomiting, difficulty swallowing, hoarse voice, loss of smell, loss of taste, fever, nasal congestion, runny nose, shortness of breath or myalgias.  Labs in June revealed normal CBC, CMP, vitamin B12 and folate.  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  She was treated with 5 day course of acyclovir and started on gabapentin 300mg  TID, however the gabapentin did not agree with her.  Received first Pfizer COVID vaccine on 02/13/2020 and second on 03/06/2020.      PAST MEDICAL HISTORY: History reviewed. No pertinent past medical history.  PAST SURGICAL HISTORY: History reviewed. No pertinent surgical history.  MEDICATIONS: Current Outpatient Medications on File Prior to Visit  Medication Sig Dispense Refill  . docusate sodium (COLACE) 50 MG capsule Take 1 capsule (50 mg total) by mouth 2  (two) times daily. 10 capsule 0  . ibuprofen (ADVIL,MOTRIN) 600 MG tablet Take 1 tablet (600 mg total) by mouth every 6 (six) hours. 30 tablet 0  . Prenatal Vit-Fe Fumarate-FA (PRENATAL MULTIVITAMIN) TABS tablet Take 1 tablet by mouth daily at 12 noon.     No current facility-administered medications on file prior to visit.    ALLERGIES: No Known Allergies  FAMILY HISTORY: Family History  Problem Relation Age of Onset  . Birth defects Neg Hx   . Asthma Neg Hx   . Cancer Neg Hx   . COPD Neg Hx   . Heart disease Neg Hx   . Hypertension Neg Hx   . Kidney disease Neg Hx   . Stroke Neg Hx    SOCIAL HISTORY: Social History   Socioeconomic History  . Marital status: Married    Spouse name: Not on file  . Number of children: Not on file  . Years of education: Not on file  . Highest education level: Not on file  Occupational History  . Not on file  Tobacco Use  . Smoking status: Never Smoker  . Smokeless tobacco: Never Used  Substance and Sexual Activity  . Alcohol use: No  . Drug use: No  . Sexual activity: Not on file  Other Topics Concern  . Not on file  Social History Narrative  . Not on file   Social Determinants of Health   Financial Resource Strain:   . Difficulty of Paying Living Expenses: Not on file  Food Insecurity:   . Worried About 03/08/2020 in the Last Year: Not on  file  . Ran Out of Food in the Last Year: Not on file  Transportation Needs:   . Lack of Transportation (Medical): Not on file  . Lack of Transportation (Non-Medical): Not on file  Physical Activity:   . Days of Exercise per Week: Not on file  . Minutes of Exercise per Session: Not on file  Stress:   . Feeling of Stress : Not on file  Social Connections:   . Frequency of Communication with Friends and Family: Not on file  . Frequency of Social Gatherings with Friends and Family: Not on file  . Attends Religious Services: Not on file  . Active Member of Clubs or Organizations:  Not on file  . Attends Banker Meetings: Not on file  . Marital Status: Not on file  Intimate Partner Violence:   . Fear of Current or Ex-Partner: Not on file  . Emotionally Abused: Not on file  . Physically Abused: Not on file  . Sexually Abused: Not on file    Objective:  Blood pressure 100/67, pulse 99, height 5\' 3"  (1.6 m), weight 181 lb 6.4 oz (82.3 kg), SpO2 98 %, unknown if currently breastfeeding. General: No acute distress.  Patient appears well-groomed.   Head:  Normocephalic/atraumatic Eyes:  fundi examined but not visualized Neck: supple, no paraspinal tenderness, full range of motion Back: No paraspinal tenderness Heart: regular rate and rhythm Lungs: Clear to auscultation bilaterally. Vascular: No carotid bruits. Neurological Exam: Mental status: alert and oriented to person, place, and time, recent and remote memory intact, fund of knowledge intact, attention and concentration intact, speech fluent and not dysarthric, language intact. Cranial nerves: CN I: not tested CN II: pupils equal, round and reactive to light, visual fields intact CN III, IV, VI:  full range of motion, no nystagmus, no ptosis CN V: facial sensation intact. CN VII: upper and lower face symmetric CN VIII: hearing intact CN IX, X: gag intact, uvula midline CN XI: sternocleidomastoid and trapezius muscles intact CN XII: tongue midline Bulk & Tone: normal, no fasciculations. Motor:  muscle strength 5/5 throughout Sensation:  Pinprick, temperature and vibratory sensation intact. Deep Tendon Reflexes:  2+ throughout,  toes downgoing.   Finger to nose testing:  Without dysmetria.   Heel to shin:  Without dysmetria.   Gait:  Normal station and stride.  Romberg negative.  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  Start amitriptyline 10mg  and increase in one week to 20mg  at bedtime.  If no improvement in 5 weeks, then I would increase to 50mg  at bedtime.  Advised to  keep hydrated as this medication may aggravated dry mouth. 2.  Ideally, I would like to get an MRI of the brain with and without contrast to rule out alternative secondary etiology.  She is without insurance at this time.  Will provide information about applying for the [facility].  Once she is on it, she should contact and we can order the MRI 3.  PCP may want to consider referral to ENT to rule out other etiology 4.  Follow up 4 to 6 months.    Thank you for allowing me to take part in the care of this patient.  , DO  CC:  , PA-C

## 2020-07-26 ENCOUNTER — Ambulatory Visit: Payer: Self-pay | Admitting: Neurology

## 2020-07-26 ENCOUNTER — Encounter: Payer: Self-pay | Admitting: Neurology

## 2020-07-26 ENCOUNTER — Other Ambulatory Visit: Payer: Self-pay

## 2020-07-26 VITALS — BP 100/67 | HR 99 | Ht 63.0 in | Wt 181.4 lb

## 2020-07-26 DIAGNOSIS — G521 Disorders of glossopharyngeal nerve: Secondary | ICD-10-CM

## 2020-07-26 MED ORDER — AMITRIPTYLINE HCL 10 MG PO TABS: ORAL_TABLET | ORAL | 0 refills | Status: AC

## 2020-07-26 NOTE — Patient Instructions (Signed)
1. Para ayudar con el dolor nervioso, comenzar con tabletas de amitriptilina de 10 mg. Tome 1 tableta a la hora de acostarse durante [place], luego aumente a 2 tabletas a la hora de [place]. Contcteme para actualizar y Education administrator. 2. Intente inscribirse en el Programa de asistencia financiera [facility]. De esa [place], podemos obtener una resonancia magntica del cerebro. Una vez que est cubierto, contctenos y haremos un pedido para la Health visitor. 3. Es posible que su PCP desee considerar derivarlo a Tax inspector. 4. Seguimiento de 4 a 6 meses.        1.  To help with nerve pain, I will start you on amitriptyline 10mg  tablets.  Take 1 tablet at bedtime for one week, then increase to 2 tablets at bedtime.  Contact me for update and refill. 2.  Try to sign up for the [facility].  That way, we can get an MRI of the brain.  Once you are covered, contact [facility] and we will place an order for the MRI 3.  Your PCP may want to consider referring you to an otolaryngologist.   4.  Follow up 4 to 6 months.

## 2021-01-21 NOTE — Progress Notes (Deleted)
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  Spanish-speaking interpreter present.  UPDATE: Started amitriptyline, *** Advised patient to apply for the [employer] Program so that we can obtain an MRI.  ***  HISTORY: In 2021, she began experiencing burning sensation in her mouth.  It feels like she ate spicy food.  Her tongue also feels numb.  It started just in the lower mouth, then the posterior tongue, then upper palate and eventually the entire inner mouth, tongue and throat.  It has gradually become worse.  No preceding dental procedures.  Talking, eating or drinking may aggravate it.  Nothing relieves it.  She also complained of white patches on her tonslies, redness of pharynx and tonsils, burning sensation in her throat, swollen tonsils, dry mouth and halitosis.  She was found to have no tonsil stones, swollen ducts or erythema or blockage of ducts.  She denied headache, nausea, vomiting, difficulty swallowing, hoarse voice, loss of smell, loss of taste, fever, nasal congestion, runny nose, shortness of breath or myalgias.  Labs in June revealed normal CBC, CMP, vitamin B12 and folate.  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  She was treated with 5 day course of acyclovir and started on gabapentin 300mg  TID, however the gabapentin did not agree with her.  Received first Pfizer COVID vaccine on 02/13/2020 and second on 03/06/2020.      PAST MEDICAL HISTORY: No past medical history on file.  MEDICATIONS: Current Outpatient Medications on File Prior to Visit  Medication Sig Dispense Refill  . amitriptyline (ELAVIL) 10 MG tablet Take 1 tablet at bedtime for one week, then increase to 2 tablets at bedtime. 60 tablet 0  . docusate sodium (COLACE) 50 MG capsule Take 1 capsule (50 mg total) by  mouth 2 (two) times daily. (Patient not taking: Reported on 07/26/2020) 10 capsule 0  . ibuprofen (ADVIL,MOTRIN) 600 MG tablet Take 1 tablet (600 mg total) by mouth every 6 (six) hours. (Patient not taking: Reported on 07/26/2020) 30 tablet 0  . Prenatal Vit-Fe Fumarate-FA (PRENATAL MULTIVITAMIN) TABS tablet Take 1 tablet by mouth daily at 12 noon. (Patient not taking: Reported on 07/26/2020)     No current facility-administered medications on file prior to visit.    ALLERGIES: No Known Allergies  FAMILY HISTORY: Family History  Problem Relation Age of Onset  . Birth defects Neg Hx   . Asthma Neg Hx   . Cancer Neg Hx   . COPD Neg Hx   . Heart disease Neg Hx   . Hypertension Neg Hx   . Kidney disease Neg Hx   . Stroke Neg Hx       Objective:  *** General: No acute distress.  Patient appears ***-groomed.   Head:  Normocephalic/atraumatic Eyes:  Fundi examined but not visualized Neck: supple, no paraspinal tenderness, full range of motion Heart:  Regular rate and rhythm Lungs:  Clear to auscultation bilaterally Back: No paraspinal tenderness Neurological Exam: alert and oriented to person, place, and time. Attention span and concentration intact, recent and remote memory intact, fund of knowledge intact.  Speech fluent and not dysarthric, language intact.  CN II-XII intact. Bulk and tone normal, muscle strength 5/5 throughout.  Sensation to light touch, temperature and vibration intact.  Deep tendon reflexes 2+ throughout, toes downgoing.  Finger to nose and  heel to shin testing intact.  Gait normal, Romberg negative.     Shon Millet, DO

## 2021-01-24 ENCOUNTER — Ambulatory Visit: Payer: Self-pay | Admitting: Neurology
